# Patient Record
Sex: Female | Born: 1965 | Hispanic: No | Marital: Married
Health system: Southern US, Community
[De-identification: ages and names within clinical notes are randomized; demographics above are authoritative.]

---

## 2000-02-06 ENCOUNTER — Other Ambulatory Visit: Admission: RE | Admit: 2000-02-06 | Discharge: 2000-02-06 | Payer: Self-pay | Admitting: Obstetrics and Gynecology

## 2001-02-10 ENCOUNTER — Other Ambulatory Visit: Admission: RE | Admit: 2001-02-10 | Discharge: 2001-02-10 | Payer: Self-pay | Admitting: Obstetrics and Gynecology

## 2002-01-18 ENCOUNTER — Encounter: Admission: RE | Admit: 2002-01-18 | Discharge: 2002-01-18 | Payer: Self-pay | Admitting: Family Medicine

## 2002-01-21 ENCOUNTER — Encounter: Admission: RE | Admit: 2002-01-21 | Discharge: 2002-01-21 | Payer: Self-pay | Admitting: Family Medicine

## 2002-01-21 ENCOUNTER — Encounter: Payer: Self-pay | Admitting: Family Medicine

## 2002-02-18 ENCOUNTER — Other Ambulatory Visit: Admission: RE | Admit: 2002-02-18 | Discharge: 2002-02-18 | Payer: Self-pay | Admitting: Obstetrics and Gynecology

## 2002-02-23 ENCOUNTER — Encounter: Admission: RE | Admit: 2002-02-23 | Discharge: 2002-02-23 | Payer: Self-pay | Admitting: Family Medicine

## 2002-02-23 ENCOUNTER — Encounter: Payer: Self-pay | Admitting: Family Medicine

## 2003-03-09 ENCOUNTER — Other Ambulatory Visit: Admission: RE | Admit: 2003-03-09 | Discharge: 2003-03-09 | Payer: Self-pay | Admitting: Obstetrics and Gynecology

## 2004-04-10 ENCOUNTER — Other Ambulatory Visit: Admission: RE | Admit: 2004-04-10 | Discharge: 2004-04-10 | Payer: Self-pay | Admitting: Obstetrics and Gynecology

## 2009-07-04 ENCOUNTER — Encounter: Admission: RE | Admit: 2009-07-04 | Discharge: 2009-07-04 | Payer: Self-pay | Admitting: Obstetrics and Gynecology

## 2010-06-06 ENCOUNTER — Other Ambulatory Visit: Admission: RE | Admit: 2010-06-06 | Discharge: 2010-06-06 | Payer: Self-pay | Admitting: Family Medicine

## 2010-07-09 ENCOUNTER — Encounter: Admission: RE | Admit: 2010-07-09 | Discharge: 2010-07-09 | Payer: Self-pay | Admitting: Obstetrics and Gynecology

## 2011-07-16 ENCOUNTER — Other Ambulatory Visit: Payer: Self-pay | Admitting: Family Medicine

## 2011-07-16 DIAGNOSIS — Z1231 Encounter for screening mammogram for malignant neoplasm of breast: Secondary | ICD-10-CM

## 2011-07-29 ENCOUNTER — Ambulatory Visit
Admission: RE | Admit: 2011-07-29 | Discharge: 2011-07-29 | Disposition: A | Discharge status: Home / Self Care | Payer: 59 | Source: Ambulatory Visit | Attending: Family Medicine | Admitting: Family Medicine

## 2011-07-29 DIAGNOSIS — Z1231 Encounter for screening mammogram for malignant neoplasm of breast: Secondary | ICD-10-CM

## 2012-08-23 ENCOUNTER — Other Ambulatory Visit: Payer: Self-pay | Admitting: Family Medicine

## 2012-08-23 DIAGNOSIS — Z1231 Encounter for screening mammogram for malignant neoplasm of breast: Secondary | ICD-10-CM

## 2012-09-15 ENCOUNTER — Ambulatory Visit
Admission: RE | Admit: 2012-09-15 | Discharge: 2012-09-15 | Disposition: A | Discharge status: Home / Self Care | Payer: 59 | Source: Ambulatory Visit | Attending: Family Medicine | Admitting: Family Medicine

## 2012-09-15 DIAGNOSIS — Z1231 Encounter for screening mammogram for malignant neoplasm of breast: Secondary | ICD-10-CM

## 2013-03-23 ENCOUNTER — Other Ambulatory Visit (HOSPITAL_COMMUNITY)
Admission: RE | Admit: 2013-03-23 | Discharge: 2013-03-23 | Disposition: A | Discharge status: Home / Self Care | Payer: 59 | Source: Ambulatory Visit | Attending: Family Medicine | Admitting: Family Medicine

## 2013-03-23 ENCOUNTER — Other Ambulatory Visit: Payer: Self-pay | Admitting: Family Medicine

## 2013-03-23 DIAGNOSIS — Z0000 Encounter for general adult medical examination without abnormal findings: Secondary | ICD-10-CM | POA: Insufficient documentation

## 2013-03-23 DIAGNOSIS — E049 Nontoxic goiter, unspecified: Secondary | ICD-10-CM

## 2013-04-08 ENCOUNTER — Ambulatory Visit
Admission: RE | Admit: 2013-04-08 | Discharge: 2013-04-08 | Disposition: A | Discharge status: Home / Self Care | Payer: 59 | Source: Ambulatory Visit | Attending: Family Medicine | Admitting: Family Medicine

## 2013-04-08 DIAGNOSIS — E049 Nontoxic goiter, unspecified: Secondary | ICD-10-CM

## 2013-04-13 ENCOUNTER — Other Ambulatory Visit: Payer: Self-pay | Admitting: Physician Assistant

## 2013-09-06 ENCOUNTER — Other Ambulatory Visit: Payer: Self-pay

## 2013-09-06 DIAGNOSIS — Z1231 Encounter for screening mammogram for malignant neoplasm of breast: Secondary | ICD-10-CM

## 2013-09-20 ENCOUNTER — Ambulatory Visit
Admission: RE | Admit: 2013-09-20 | Discharge: 2013-09-20 | Disposition: A | Discharge status: Home / Self Care | Payer: 59 | Source: Ambulatory Visit

## 2013-09-20 DIAGNOSIS — Z1231 Encounter for screening mammogram for malignant neoplasm of breast: Secondary | ICD-10-CM

## 2014-08-17 ENCOUNTER — Other Ambulatory Visit: Payer: Self-pay

## 2014-08-17 DIAGNOSIS — Z1231 Encounter for screening mammogram for malignant neoplasm of breast: Secondary | ICD-10-CM

## 2014-08-31 ENCOUNTER — Ambulatory Visit
Admission: RE | Admit: 2014-08-31 | Discharge: 2014-08-31 | Disposition: A | Discharge status: Home / Self Care | Payer: Self-pay | Source: Ambulatory Visit

## 2014-08-31 ENCOUNTER — Encounter (INDEPENDENT_AMBULATORY_CARE_PROVIDER_SITE_OTHER): Payer: Self-pay

## 2014-08-31 DIAGNOSIS — Z1231 Encounter for screening mammogram for malignant neoplasm of breast: Secondary | ICD-10-CM

## 2014-09-01 ENCOUNTER — Other Ambulatory Visit: Payer: Self-pay | Admitting: Student

## 2014-09-01 DIAGNOSIS — R928 Other abnormal and inconclusive findings on diagnostic imaging of breast: Secondary | ICD-10-CM

## 2014-09-15 ENCOUNTER — Other Ambulatory Visit: Payer: Self-pay | Admitting: Family Medicine

## 2014-09-15 DIAGNOSIS — R928 Other abnormal and inconclusive findings on diagnostic imaging of breast: Secondary | ICD-10-CM

## 2014-09-18 ENCOUNTER — Ambulatory Visit
Admission: RE | Admit: 2014-09-18 | Discharge: 2014-09-18 | Disposition: A | Discharge status: Home / Self Care | Payer: No Typology Code available for payment source | Source: Ambulatory Visit | Attending: Student | Admitting: Student

## 2014-09-18 DIAGNOSIS — R928 Other abnormal and inconclusive findings on diagnostic imaging of breast: Secondary | ICD-10-CM

## 2015-02-22 ENCOUNTER — Other Ambulatory Visit: Payer: Self-pay | Admitting: Student

## 2015-02-22 DIAGNOSIS — N6320 Unspecified lump in the left breast, unspecified quadrant: Secondary | ICD-10-CM

## 2015-02-23 ENCOUNTER — Other Ambulatory Visit: Payer: Self-pay | Admitting: Obstetrics and Gynecology

## 2015-02-23 DIAGNOSIS — N6320 Unspecified lump in the left breast, unspecified quadrant: Secondary | ICD-10-CM

## 2015-03-02 ENCOUNTER — Ambulatory Visit
Admission: RE | Admit: 2015-03-02 | Discharge: 2015-03-02 | Disposition: A | Discharge status: Home / Self Care | Payer: 59 | Source: Ambulatory Visit | Attending: Student | Admitting: Student

## 2015-03-02 DIAGNOSIS — N6320 Unspecified lump in the left breast, unspecified quadrant: Secondary | ICD-10-CM

## 2015-09-25 ENCOUNTER — Other Ambulatory Visit: Payer: Self-pay | Admitting: Obstetrics and Gynecology

## 2015-09-25 DIAGNOSIS — N6320 Unspecified lump in the left breast, unspecified quadrant: Secondary | ICD-10-CM

## 2015-10-02 ENCOUNTER — Ambulatory Visit
Admission: RE | Admit: 2015-10-02 | Discharge: 2015-10-02 | Disposition: A | Discharge status: Home / Self Care | Payer: 59 | Source: Ambulatory Visit | Attending: Obstetrics and Gynecology | Admitting: Obstetrics and Gynecology

## 2015-10-02 DIAGNOSIS — N6320 Unspecified lump in the left breast, unspecified quadrant: Secondary | ICD-10-CM

## 2018-01-14 ENCOUNTER — Other Ambulatory Visit: Payer: Self-pay | Admitting: Obstetrics and Gynecology

## 2018-01-14 DIAGNOSIS — Z139 Encounter for screening, unspecified: Secondary | ICD-10-CM

## 2018-02-04 ENCOUNTER — Ambulatory Visit: Payer: 59

## 2018-02-15 ENCOUNTER — Ambulatory Visit
Admission: RE | Admit: 2018-02-15 | Discharge: 2018-02-15 | Disposition: A | Discharge status: Home / Self Care | Payer: BLUE CROSS/BLUE SHIELD | Source: Ambulatory Visit | Attending: Obstetrics and Gynecology | Admitting: Obstetrics and Gynecology

## 2018-02-15 DIAGNOSIS — Z139 Encounter for screening, unspecified: Secondary | ICD-10-CM

## 2019-02-09 ENCOUNTER — Other Ambulatory Visit: Payer: Self-pay | Admitting: Obstetrics and Gynecology

## 2019-02-09 DIAGNOSIS — Z1231 Encounter for screening mammogram for malignant neoplasm of breast: Secondary | ICD-10-CM

## 2019-03-10 ENCOUNTER — Ambulatory Visit: Payer: BLUE CROSS/BLUE SHIELD

## 2019-03-18 ENCOUNTER — Inpatient Hospital Stay: Admission: RE | Admit: 2019-03-18 | Payer: BLUE CROSS/BLUE SHIELD | Source: Ambulatory Visit

## 2019-05-03 ENCOUNTER — Ambulatory Visit: Payer: Self-pay

## 2019-06-25 ENCOUNTER — Other Ambulatory Visit: Payer: Self-pay

## 2019-06-25 ENCOUNTER — Ambulatory Visit
Admission: RE | Admit: 2019-06-25 | Discharge: 2019-06-25 | Disposition: A | Discharge status: Home / Self Care | Payer: Managed Care, Other (non HMO) | Source: Ambulatory Visit | Attending: Obstetrics and Gynecology | Admitting: Obstetrics and Gynecology

## 2019-06-25 DIAGNOSIS — Z1231 Encounter for screening mammogram for malignant neoplasm of breast: Secondary | ICD-10-CM

## 2019-08-08 ENCOUNTER — Other Ambulatory Visit: Payer: Self-pay

## 2020-05-21 ENCOUNTER — Other Ambulatory Visit: Payer: Self-pay | Admitting: Obstetrics and Gynecology

## 2020-05-21 DIAGNOSIS — Z1231 Encounter for screening mammogram for malignant neoplasm of breast: Secondary | ICD-10-CM

## 2020-06-26 ENCOUNTER — Other Ambulatory Visit: Payer: Self-pay

## 2020-06-26 ENCOUNTER — Ambulatory Visit
Admission: RE | Admit: 2020-06-26 | Discharge: 2020-06-26 | Disposition: A | Discharge status: Home / Self Care | Payer: Managed Care, Other (non HMO) | Source: Ambulatory Visit | Attending: Obstetrics and Gynecology | Admitting: Obstetrics and Gynecology

## 2020-06-26 DIAGNOSIS — Z1231 Encounter for screening mammogram for malignant neoplasm of breast: Secondary | ICD-10-CM

## 2021-09-03 ENCOUNTER — Other Ambulatory Visit: Payer: Self-pay | Admitting: Obstetrics and Gynecology

## 2021-09-03 DIAGNOSIS — Z1231 Encounter for screening mammogram for malignant neoplasm of breast: Secondary | ICD-10-CM

## 2021-09-06 ENCOUNTER — Ambulatory Visit
Admission: RE | Admit: 2021-09-06 | Discharge: 2021-09-06 | Disposition: A | Discharge status: Home / Self Care | Payer: BC Managed Care – PPO | Source: Ambulatory Visit | Attending: Obstetrics and Gynecology | Admitting: Obstetrics and Gynecology

## 2021-09-06 ENCOUNTER — Other Ambulatory Visit: Payer: Self-pay

## 2021-09-06 DIAGNOSIS — Z1231 Encounter for screening mammogram for malignant neoplasm of breast: Secondary | ICD-10-CM

## 2021-11-01 IMAGING — MG MM DIGITAL SCREENING BILAT W/ TOMO AND CAD
8 series · 8 of 24 positions shown · non-contrast
Comparison: Previous exam(s).

CLINICAL DATA: Screening.

EXAM:
DIGITAL SCREENING BILATERAL MAMMOGRAM WITH TOMOSYNTHESIS AND CAD
TECHNIQUE: Bilateral screening digital craniocaudal and mediolateral oblique
mammograms were obtained. Bilateral screening digital breast
tomosynthesis was performed. The images were evaluated with
computer-aided detection.

[R MLO synth-2D]
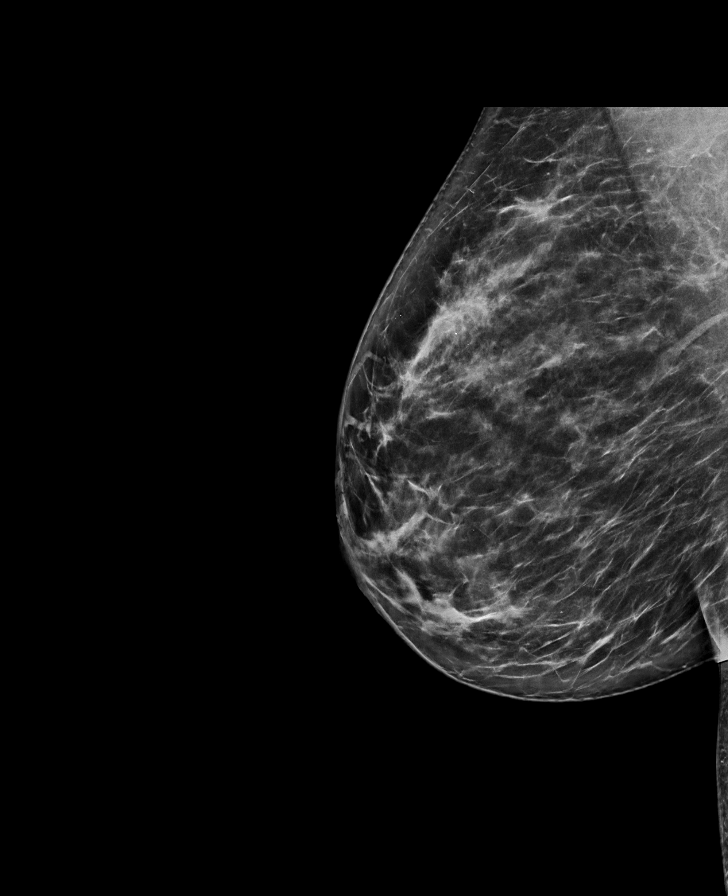

[R CC synth-2D]
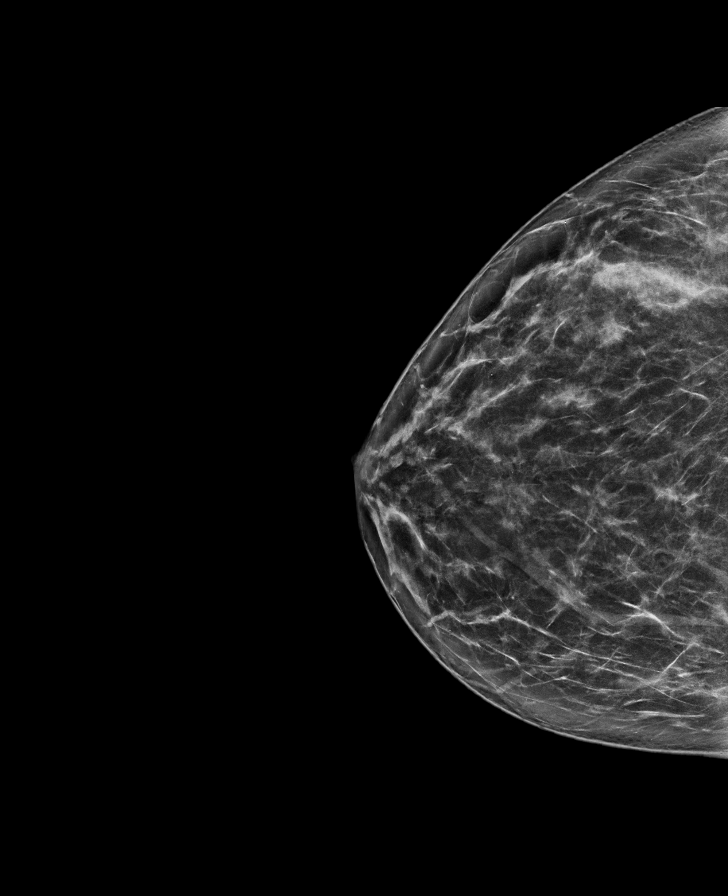

[L MLO synth-2D]
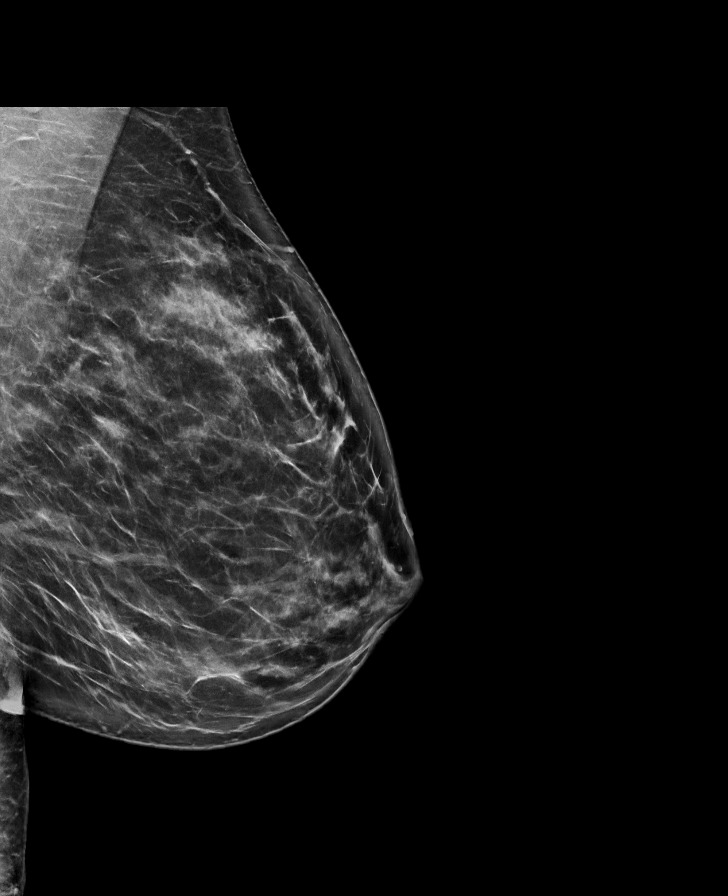

[L CC synth-2D]
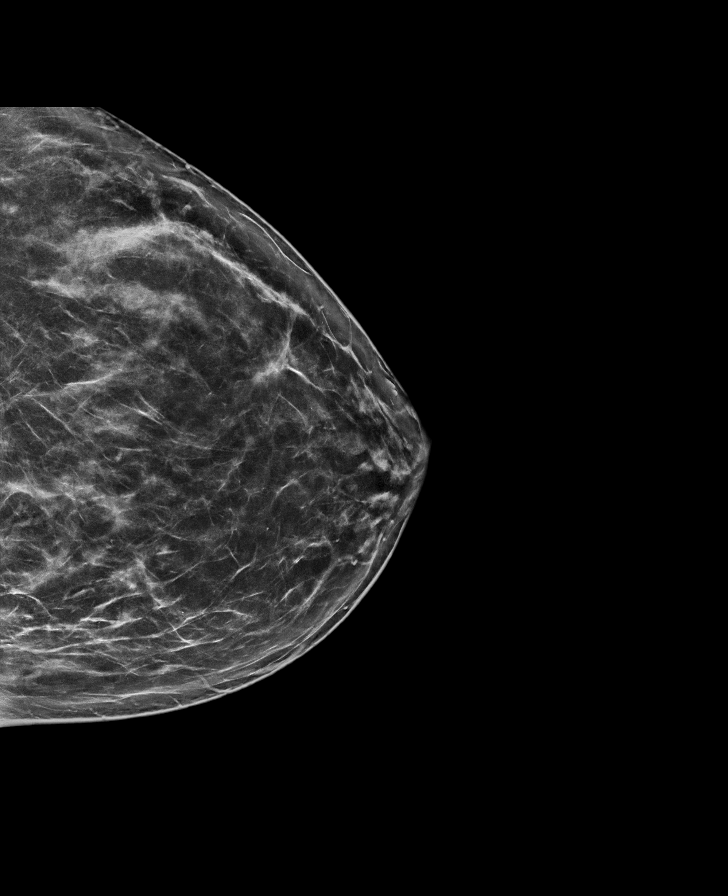

[R MLO tomo · tomo slice 42/83.0]
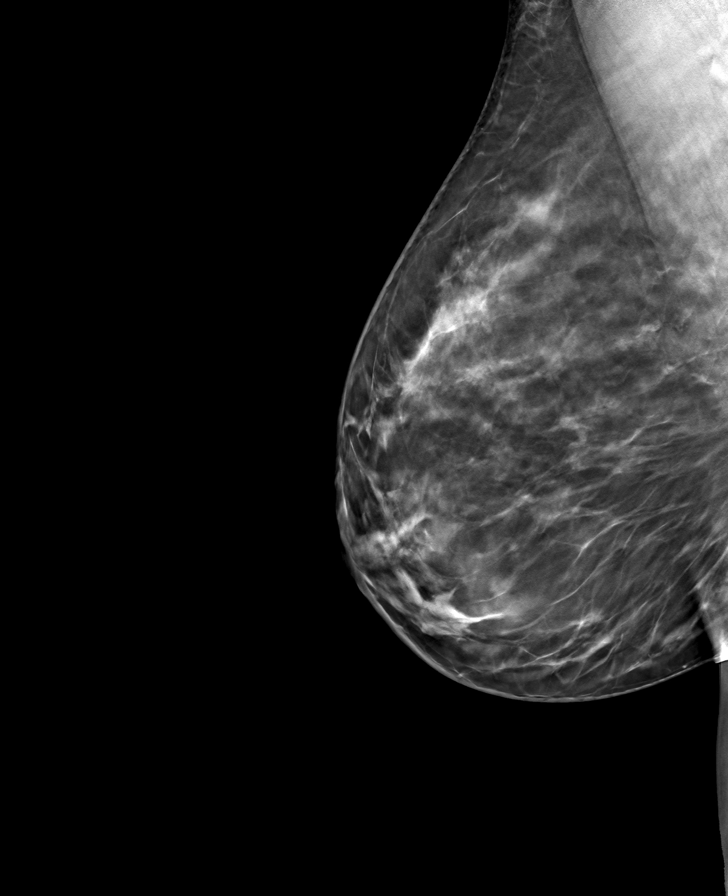

[L MLO tomo · tomo slice 44/87.0]
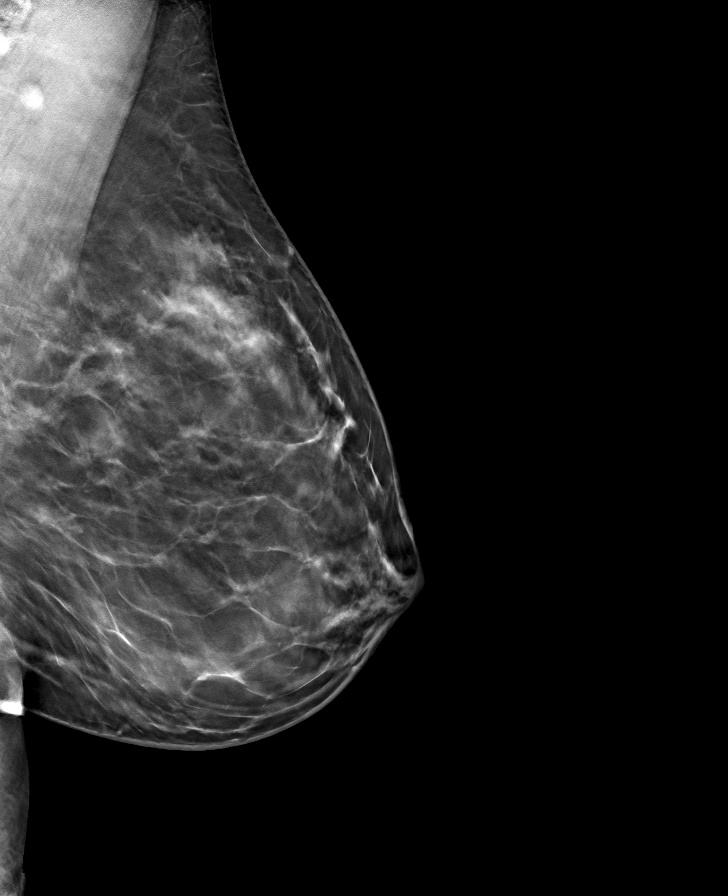

[R CC tomo · tomo slice 40/79.0]
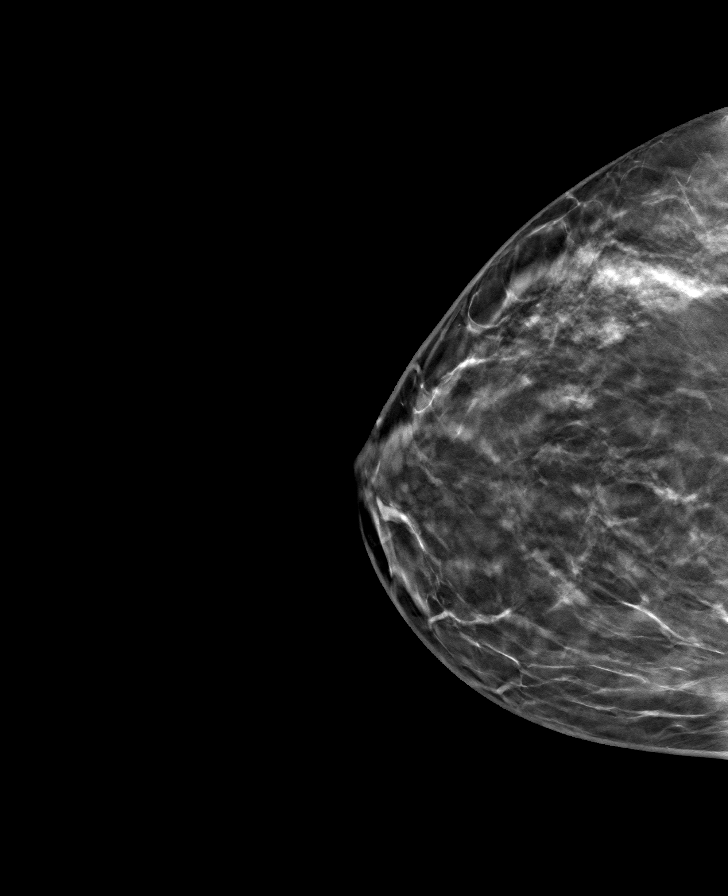

[L CC tomo · tomo slice 41/82.0]
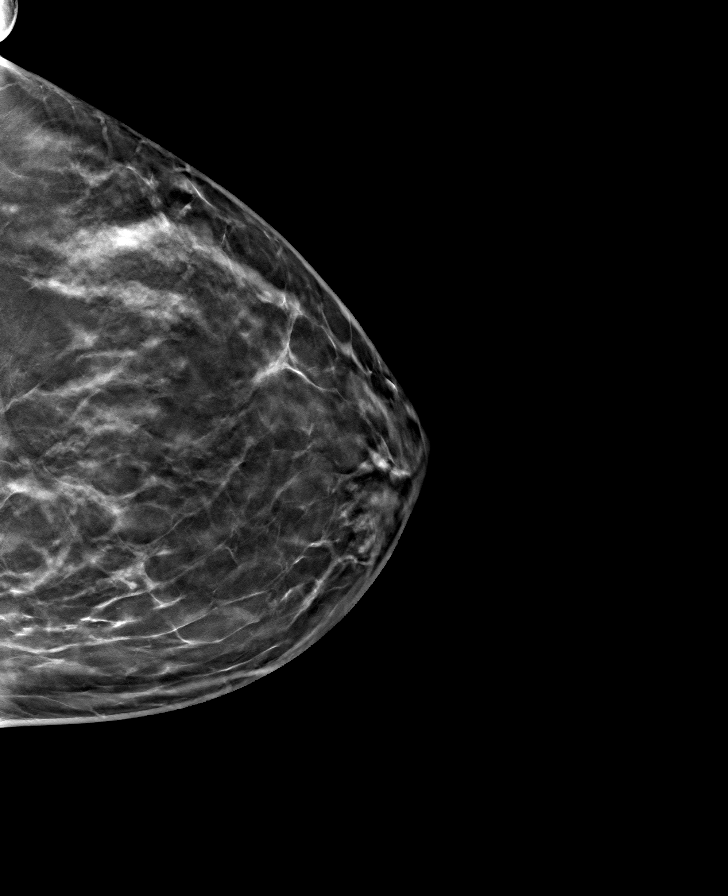

[8 of 24 positions shown; findings below may reference images not displayed]

ACR Breast Density Category c: The breast tissue is heterogeneously
dense, which may obscure small masses.
FINDINGS: There are no findings suspicious for malignancy.
IMPRESSION: No mammographic evidence of malignancy. A result letter of this
screening mammogram will be mailed directly to the patient.

RECOMMENDATION:
Screening mammogram in one year. (Code:Q3-W-BC3)

BI-RADS CATEGORY  1: Negative.

## 2023-09-07 ENCOUNTER — Other Ambulatory Visit: Payer: Self-pay | Admitting: Obstetrics and Gynecology

## 2023-09-07 DIAGNOSIS — Z1231 Encounter for screening mammogram for malignant neoplasm of breast: Secondary | ICD-10-CM

## 2023-09-14 ENCOUNTER — Ambulatory Visit
Admission: RE | Admit: 2023-09-14 | Discharge: 2023-09-14 | Disposition: A | Discharge status: Home / Self Care | Payer: BC Managed Care – PPO | Source: Ambulatory Visit | Attending: Obstetrics and Gynecology | Admitting: Obstetrics and Gynecology

## 2023-09-14 DIAGNOSIS — Z1231 Encounter for screening mammogram for malignant neoplasm of breast: Secondary | ICD-10-CM

## 2023-09-16 ENCOUNTER — Other Ambulatory Visit: Payer: Self-pay | Admitting: Obstetrics and Gynecology

## 2023-09-16 DIAGNOSIS — R928 Other abnormal and inconclusive findings on diagnostic imaging of breast: Secondary | ICD-10-CM

## 2023-09-17 ENCOUNTER — Ambulatory Visit
Admission: RE | Admit: 2023-09-17 | Discharge: 2023-09-17 | Disposition: A | Discharge status: Home / Self Care | Payer: BC Managed Care – PPO | Source: Ambulatory Visit | Attending: Obstetrics and Gynecology | Admitting: Obstetrics and Gynecology

## 2023-09-17 DIAGNOSIS — R928 Other abnormal and inconclusive findings on diagnostic imaging of breast: Secondary | ICD-10-CM

## 2023-09-22 ENCOUNTER — Other Ambulatory Visit: Payer: BC Managed Care – PPO
